# Patient Record
Sex: Male | Born: 1975 | Race: White | Hispanic: No | Marital: Married | Smoking: Current some day smoker
Health system: Southern US, Community
[De-identification: ages and names within clinical notes are randomized; demographics above are authoritative.]

## PROBLEM LIST (undated history)

## (undated) DIAGNOSIS — Z87828 Personal history of other (healed) physical injury and trauma: Secondary | ICD-10-CM

## (undated) DIAGNOSIS — R2231 Localized swelling, mass and lump, right upper limb: Secondary | ICD-10-CM

## (undated) DIAGNOSIS — K219 Gastro-esophageal reflux disease without esophagitis: Secondary | ICD-10-CM

## (undated) DIAGNOSIS — S5320XA Traumatic rupture of unspecified radial collateral ligament, initial encounter: Secondary | ICD-10-CM

## (undated) HISTORY — PX: WISDOM TOOTH EXTRACTION: SHX21

---

## 2012-02-17 ENCOUNTER — Emergency Department (HOSPITAL_COMMUNITY)
Admission: EM | Admit: 2012-02-17 | Discharge: 2012-02-17 | Disposition: A | Discharge status: Home / Self Care | Payer: BC Managed Care – PPO | Attending: Emergency Medicine | Admitting: Emergency Medicine

## 2012-02-17 ENCOUNTER — Emergency Department (HOSPITAL_COMMUNITY): Payer: BC Managed Care – PPO

## 2012-02-17 ENCOUNTER — Encounter (HOSPITAL_COMMUNITY): Payer: Self-pay | Admitting: Emergency Medicine

## 2012-02-17 DIAGNOSIS — N209 Urinary calculus, unspecified: Secondary | ICD-10-CM

## 2012-02-17 DIAGNOSIS — R109 Unspecified abdominal pain: Secondary | ICD-10-CM | POA: Insufficient documentation

## 2012-02-17 DIAGNOSIS — N23 Unspecified renal colic: Secondary | ICD-10-CM

## 2012-02-17 LAB — COMPREHENSIVE METABOLIC PANEL
ALT: 48 U/L (ref 0–53)
AST: 36 U/L (ref 0–37)
Alkaline Phosphatase: 82 U/L (ref 39–117)
CO2: 20 mEq/L (ref 19–32)
Chloride: 102 mEq/L (ref 96–112)
GFR calc Af Amer: >90 mL/min (ref >90)
GFR calc non Af Amer: >90 mL/min (ref >90)
Glucose, Bld: 126 mg/dL — ABNORMAL HIGH (ref 70–99)
Potassium: 3.6 mEq/L (ref 3.5–5.1)
Sodium: 135 mEq/L (ref 135–145)
Total Bilirubin: 0.3 mg/dL (ref 0.3–1.2)

## 2012-02-17 LAB — URINE MICROSCOPIC-ADD ON

## 2012-02-17 LAB — URINALYSIS, ROUTINE W REFLEX MICROSCOPIC
Bilirubin Urine: NEGATIVE
Nitrite: NEGATIVE
Protein, ur: NEGATIVE mg/dL
Specific Gravity, Urine: 1.029 (ref 1.005–1.030)
Urobilinogen, UA: 0.2 mg/dL (ref 0.0–1.0)

## 2012-02-17 LAB — CBC WITH DIFFERENTIAL/PLATELET
Basophils Absolute: 0 10*3/uL (ref 0.0–0.1)
Basophils Relative: 0 % (ref 0–1)
Eosinophils Absolute: 0.1 10*3/uL (ref 0.0–0.7)
MCH: 32.4 pg (ref 26.0–34.0)
MCHC: 35.1 g/dL (ref 30.0–36.0)
Monocytes Relative: 5 % (ref 3–12)
Neutro Abs: 8.5 10*3/uL — ABNORMAL HIGH (ref 1.7–7.7)
Neutrophils Relative %: 72 % (ref 43–77)
RDW: 12.9 % (ref 11.5–15.5)

## 2012-02-17 MED ORDER — OXYCODONE-ACETAMINOPHEN 5-325 MG PO TABS: 1.0000 | ORAL_TABLET | ORAL | Status: AC | PRN

## 2012-02-17 MED ORDER — FENTANYL CITRATE 0.05 MG/ML IJ SOLN
50.0000 ug | Freq: Once | INTRAMUSCULAR | Status: AC
  Administered 2012-02-17: 50 ug via INTRAVENOUS
  Filled 2012-02-17: qty 2

## 2012-02-17 MED ORDER — SODIUM CHLORIDE 0.9 % IV SOLN
1000.0000 mL | INTRAVENOUS | Status: DC
  Administered 2012-02-17: 1000 mL via INTRAVENOUS

## 2012-02-17 MED ORDER — ONDANSETRON HCL 4 MG/2ML IJ SOLN
4.0000 mg | Freq: Once | INTRAMUSCULAR | Status: AC
  Administered 2012-02-17: 4 mg via INTRAVENOUS
  Filled 2012-02-17: qty 2

## 2012-02-17 MED ORDER — LORAZEPAM 2 MG/ML IJ SOLN: 1.0000 mg | Freq: Once | INTRAMUSCULAR | Status: DC

## 2012-02-17 NOTE — ED Notes (Signed)
Pt presenting to ed with c/o left side abdominal pain that radiates into his back pt states onset 6:30am pt states no nausea or vomiting last bowel movement yesterday

## 2012-02-17 NOTE — ED Notes (Signed)
Pt ambulatory too restroom at this time with minimal assistance. Only needed help with IV bag

## 2012-02-17 NOTE — ED Provider Notes (Signed)
History     CSN: [id number]  Arrival date & time 02/17/12  0865   First MD Initiated Contact with Patient 02/17/12 [phone]      Chief Complaint  Patient presents with  . Abdominal Pain    (Consider location/radiation/quality/duration/timing/severity/associated sxs/prior treatment) HPI Comments: Brian [name] is a 36 y.o. Male with sudden onset left flank pain at 6:30 this morning. He has associated urinary urgency, and frequency, but no hematuria. He denies fever, nausea, vomiting, or chest pain. There are no associated aggravating or palliative factors. His tried a medication yet. No known history in the family of kidney stones.  Patient is a 36 y.o. male presenting with abdominal pain. The history is provided by the patient.  Abdominal Pain The primary symptoms of the illness include abdominal pain.    History reviewed. No pertinent past medical history.  History reviewed. No pertinent past surgical history.  No family history on file.  History  Substance Use Topics  . Smoking status: Current Everyday Smoker -- 0.5 packs/day    Types: Cigarettes  . Smokeless tobacco: Not on file  . Alcohol Use: Yes     daily      Review of Systems  Gastrointestinal: Positive for abdominal pain.  All other systems reviewed and are negative.    Allergies  Penicillins  Home Medications   Current Outpatient Rx  Name Route Sig Dispense Refill  . CETIRIZINE HCL 10 MG PO TABS Oral Take 10 mg by mouth daily.    . OMEGA-3 FATTY ACIDS 1000 MG PO CAPS Oral Take 2 g by mouth daily.    Marland Kitchen GARLIC PO Oral Take 1 capsule by mouth daily.    . IBUPROFEN 200 MG PO TABS Oral Take 400 mg by mouth every 8 (eight) hours as needed. For pain.    Marland Kitchen LYSINE PO Oral Take 1 capsule by mouth daily.    . ADULT MULTIVITAMIN W/MINERALS CH Oral Take 1 tablet by mouth daily.    Marland Kitchen OVER THE COUNTER MEDICATION Oral Take 1 tablet by mouth daily. Potassium OTC supplement.    Marland Kitchen VITAMIN B-12 1000 MCG PO TABS Oral Take  1,000 mcg by mouth daily.    . OXYCODONE-ACETAMINOPHEN 5-325 MG PO TABS Oral Take 1 tablet by mouth every 4 (four) hours as needed for pain. 20 tablet 0    BP 120/70  Pulse 72  Temp 97.6 F (36.4 C) (Oral)  Resp 20  SpO2 100%  Physical Exam  Nursing note and vitals reviewed. Constitutional: He is oriented to person, place, and time. He appears well-developed and well-nourished.  HENT:  Head: Normocephalic and atraumatic.  Right Ear: External ear normal.  Left Ear: External ear normal.  Eyes: Conjunctivae and EOM are normal. Pupils are equal, round, and reactive to light.  Neck: Normal range of motion and phonation normal. Neck supple.  Cardiovascular: Normal rate, regular rhythm, normal heart sounds and intact distal pulses.   Pulmonary/Chest: Effort normal and breath sounds normal. He exhibits no bony tenderness.  Abdominal: Soft. Normal appearance. He exhibits no distension. There is no tenderness.  Genitourinary:       Mild left costovertebral angle tenderness  Musculoskeletal: Normal range of motion.  Neurological: He is alert and oriented to person, place, and time. He has normal strength. No cranial nerve deficit or sensory deficit. He exhibits normal muscle tone. Coordination normal.  Skin: Skin is warm, dry and intact.  Psychiatric: He has a normal mood and affect. His behavior is normal. Judgment and  thought content normal.    ED Course  Procedures (including critical care time)  Emergency department treatment: IV, Dilaudid, and Zofran.  Reevaluation: 11:50- pain, resolved. Findings discussed with patient and wife. Urine culture ordered  Labs Reviewed  COMPREHENSIVE METABOLIC PANEL - Abnormal; Notable for the following:    Glucose, Bld 126 (*)     All other components within normal limits  CBC WITH DIFFERENTIAL - Abnormal; Notable for the following:    WBC 11.8 (*)     Neutro Abs 8.5 (*)     All other components within normal limits  URINALYSIS, ROUTINE W REFLEX  MICROSCOPIC - Abnormal; Notable for the following:    APPearance CLOUDY (*)     Hgb urine dipstick SMALL (*)     All other components within normal limits  URINE MICROSCOPIC-ADD ON - Abnormal; Notable for the following:    Bacteria, UA FEW (*)     Crystals CA OXALATE CRYSTALS (*)     All other components within normal limits  URINE CULTURE   Ct Abdomen Pelvis Wo Contrast  02/17/2012  *RADIOLOGY REPORT*  Clinical Data: Flank pain  CT ABDOMEN AND PELVIS WITHOUT CONTRAST  Technique:  Multidetector CT imaging of the abdomen and pelvis was performed following the standard protocol without intravenous contrast.  Comparison: None  Findings: The lung bases are clear.  There is no focal liver abnormality.  The gallbladder is normal. No biliary dilatation.  Normal appearance of the pancreas.  The spleen is unremarkable.  Both adrenal glands appear normal.  The right kidney is normal. There is mild edema involving the left kidney.  Subtle perinephric and periureteral fat stranding is identified.  No significant hydronephrosis or hydroureter noted.  No renal calculi identified.  Within the urinary bladder near the left UVJ there is a stone which measures 2 mm, image 88.  There is no free fluid or fluid collections within the upper abdomen or the pelvis.  No enlarged lymph nodes within the upper abdomen or the pelvis. The stomach and small bowel loops are normal.  The appendix is identified and is normal.  The colon is unremarkable.  Review of the visualized osseous structures is unremarkable.  IMPRESSION:  1.  Mild edema involving the left kidney and left ureter is identified.  The renal stone is identified within the left side of the bladder near the left UVJ measuring approximately 2 mm.  Original Report Authenticated By: Rosealee Albee, M.D.     1. Urolithiasis   2. Ureter colic       MDM  Small, distal left ureter stone, with mild hydronephrosis. This has a high likelihood of passing. Within the first  72 hours. Possible UTI, urine culture has been ordered. Patient stable for discharge.   Plan: Home Medications- Percocet; Home Treatments- Strain urine; Recommended follow up- Urology 1 week and prn        Flint Melter, MD 02/17/12 1206

## 2012-02-19 LAB — URINE CULTURE
Colony Count: NO GROWTH
Culture: NO GROWTH

## 2013-08-09 ENCOUNTER — Emergency Department (HOSPITAL_COMMUNITY): Payer: BC Managed Care – PPO

## 2013-08-09 ENCOUNTER — Emergency Department (HOSPITAL_COMMUNITY)
Admission: EM | Admit: 2013-08-09 | Discharge: 2013-08-09 | Disposition: A | Discharge status: Home / Self Care | Payer: BC Managed Care – PPO | Attending: Emergency Medicine | Admitting: Emergency Medicine

## 2013-08-09 ENCOUNTER — Encounter (HOSPITAL_COMMUNITY): Payer: Self-pay | Admitting: Emergency Medicine

## 2013-08-09 DIAGNOSIS — M542 Cervicalgia: Secondary | ICD-10-CM

## 2013-08-09 DIAGNOSIS — M79643 Pain in unspecified hand: Secondary | ICD-10-CM

## 2013-08-09 DIAGNOSIS — Z79899 Other long term (current) drug therapy: Secondary | ICD-10-CM | POA: Insufficient documentation

## 2013-08-09 DIAGNOSIS — Y9389 Activity, other specified: Secondary | ICD-10-CM | POA: Insufficient documentation

## 2013-08-09 DIAGNOSIS — S6990XA Unspecified injury of unspecified wrist, hand and finger(s), initial encounter: Secondary | ICD-10-CM | POA: Insufficient documentation

## 2013-08-09 DIAGNOSIS — Z880 Allergy status to penicillin: Secondary | ICD-10-CM | POA: Insufficient documentation

## 2013-08-09 DIAGNOSIS — S199XXA Unspecified injury of neck, initial encounter: Secondary | ICD-10-CM

## 2013-08-09 DIAGNOSIS — F17200 Nicotine dependence, unspecified, uncomplicated: Secondary | ICD-10-CM | POA: Insufficient documentation

## 2013-08-09 DIAGNOSIS — S6980XA Other specified injuries of unspecified wrist, hand and finger(s), initial encounter: Secondary | ICD-10-CM | POA: Insufficient documentation

## 2013-08-09 DIAGNOSIS — Y92410 Unspecified street and highway as the place of occurrence of the external cause: Secondary | ICD-10-CM | POA: Insufficient documentation

## 2013-08-09 DIAGNOSIS — R0789 Other chest pain: Secondary | ICD-10-CM

## 2013-08-09 DIAGNOSIS — M47812 Spondylosis without myelopathy or radiculopathy, cervical region: Secondary | ICD-10-CM | POA: Insufficient documentation

## 2013-08-09 DIAGNOSIS — S298XXA Other specified injuries of thorax, initial encounter: Secondary | ICD-10-CM | POA: Insufficient documentation

## 2013-08-09 DIAGNOSIS — S0993XA Unspecified injury of face, initial encounter: Secondary | ICD-10-CM | POA: Insufficient documentation

## 2013-08-09 MED ORDER — MELOXICAM 7.5 MG PO TABS: 15.0000 mg | ORAL_TABLET | Freq: Every day | ORAL | Status: DC

## 2013-08-09 MED ORDER — OXYCODONE-ACETAMINOPHEN 5-325 MG PO TABS: 1.0000 | ORAL_TABLET | ORAL | Status: DC | PRN

## 2013-08-09 NOTE — ED Notes (Signed)
Pts right index finger around the proximal joint is bruised and slightly
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
## 2013-08-09 NOTE — Discharge Instructions (Signed)
Take the prescribed medication as directed.  Do not drive while taking percocet. May wish to apply heat to affected areas to help with muscle soreness and stiffness. Return to the ED for new or worsening symptoms.
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
## 2013-08-11 NOTE — ED Provider Notes (Signed)
Medical screening examination/treatment/procedure(s) were performed by non-physician practitioner and as supervising physician I was immediately available for consultation/collaboration.    Kathalene Frames, MD 08/11/13 [phone]

## 2013-09-12 ENCOUNTER — Ambulatory Visit
Admission: RE | Admit: 2013-09-12 | Discharge: 2013-09-12 | Disposition: A | Discharge status: Home / Self Care | Payer: BC Managed Care – PPO | Source: Ambulatory Visit | Attending: Family Medicine | Admitting: Family Medicine

## 2013-09-12 ENCOUNTER — Other Ambulatory Visit: Payer: Self-pay | Admitting: Family Medicine

## 2013-09-12 DIAGNOSIS — R0789 Other chest pain: Secondary | ICD-10-CM

## 2013-09-16 ENCOUNTER — Other Ambulatory Visit: Payer: Self-pay | Admitting: Orthopedic Surgery

## 2013-09-16 DIAGNOSIS — S63639A Sprain of interphalangeal joint of unspecified finger, initial encounter: Secondary | ICD-10-CM

## 2013-09-24 ENCOUNTER — Ambulatory Visit
Admission: RE | Admit: 2013-09-24 | Discharge: 2013-09-24 | Disposition: A | Discharge status: Home / Self Care | Payer: BC Managed Care – PPO | Source: Ambulatory Visit | Attending: Orthopedic Surgery | Admitting: Orthopedic Surgery

## 2013-09-24 DIAGNOSIS — S63639A Sprain of interphalangeal joint of unspecified finger, initial encounter: Secondary | ICD-10-CM

## 2013-09-24 MED ORDER — IOHEXOL 180 MG/ML  SOLN
5.0000 mL | Freq: Once | INTRAMUSCULAR | Status: AC | PRN
  Administered 2013-09-24: 5 mL via INTRA_ARTICULAR

## 2013-10-01 ENCOUNTER — Other Ambulatory Visit: Payer: Self-pay | Admitting: Orthopedic Surgery

## 2013-10-01 MED ORDER — CHLORHEXIDINE GLUCONATE 4 % EX LIQD: 60.0000 mL | Freq: Once | CUTANEOUS | Status: DC

## 2013-10-01 MED ORDER — VANCOMYCIN HCL 10 G IV SOLR: 1000.0000 mg | INTRAVENOUS | Status: AC

## 2013-10-09 DIAGNOSIS — R2231 Localized swelling, mass and lump, right upper limb: Secondary | ICD-10-CM

## 2013-10-09 DIAGNOSIS — S5320XA Traumatic rupture of unspecified radial collateral ligament, initial encounter: Secondary | ICD-10-CM

## 2013-10-09 HISTORY — DX: Localized swelling, mass and lump, right upper limb: R22.31

## 2013-10-09 HISTORY — DX: Traumatic rupture of unspecified radial collateral ligament, initial encounter: S53.20XA

## 2013-10-10 ENCOUNTER — Encounter (HOSPITAL_BASED_OUTPATIENT_CLINIC_OR_DEPARTMENT_OTHER): Payer: Self-pay | Admitting: *Deleted

## 2013-10-16 ENCOUNTER — Ambulatory Visit (HOSPITAL_BASED_OUTPATIENT_CLINIC_OR_DEPARTMENT_OTHER): Payer: BC Managed Care – PPO | Admitting: Anesthesiology

## 2013-10-16 ENCOUNTER — Encounter (HOSPITAL_BASED_OUTPATIENT_CLINIC_OR_DEPARTMENT_OTHER): Payer: Self-pay | Admitting: Anesthesiology

## 2013-10-16 ENCOUNTER — Encounter (HOSPITAL_BASED_OUTPATIENT_CLINIC_OR_DEPARTMENT_OTHER)
Admission: RE | Disposition: A | Discharge status: Home / Self Care | Payer: Self-pay | Source: Ambulatory Visit | Attending: Orthopedic Surgery

## 2013-10-16 ENCOUNTER — Encounter (HOSPITAL_BASED_OUTPATIENT_CLINIC_OR_DEPARTMENT_OTHER): Payer: BC Managed Care – PPO | Admitting: Anesthesiology

## 2013-10-16 ENCOUNTER — Ambulatory Visit (HOSPITAL_BASED_OUTPATIENT_CLINIC_OR_DEPARTMENT_OTHER)
Admission: RE | Admit: 2013-10-16 | Discharge: 2013-10-16 | Disposition: A | Discharge status: Home / Self Care | Payer: BC Managed Care – PPO | Source: Ambulatory Visit | Attending: Orthopedic Surgery | Admitting: Orthopedic Surgery

## 2013-10-16 DIAGNOSIS — S53439A Radial collateral ligament sprain of unspecified elbow, initial encounter: Secondary | ICD-10-CM | POA: Insufficient documentation

## 2013-10-16 DIAGNOSIS — D481 Neoplasm of uncertain behavior of connective and other soft tissue: Secondary | ICD-10-CM | POA: Insufficient documentation

## 2013-10-16 DIAGNOSIS — D4819 Other specified neoplasm of uncertain behavior of connective and other soft tissue: Secondary | ICD-10-CM | POA: Insufficient documentation

## 2013-10-16 DIAGNOSIS — K219 Gastro-esophageal reflux disease without esophagitis: Secondary | ICD-10-CM | POA: Insufficient documentation

## 2013-10-16 HISTORY — PX: LIGAMENT REPAIR: SHX5444

## 2013-10-16 HISTORY — DX: Localized swelling, mass and lump, right upper limb: R22.31

## 2013-10-16 HISTORY — PX: MASS EXCISION: SHX2000

## 2013-10-16 HISTORY — DX: Traumatic rupture of unspecified radial collateral ligament, initial encounter: S53.20XA

## 2013-10-16 HISTORY — DX: Personal history of other (healed) physical injury and trauma: Z87.828

## 2013-10-16 HISTORY — DX: Gastro-esophageal reflux disease without esophagitis: K21.9

## 2013-10-16 LAB — POCT HEMOGLOBIN-HEMACUE: Hemoglobin: 14.6 g/dL (ref 13.0–17.0)

## 2013-10-16 SURGERY — REPAIR, LIGAMENT
Anesthesia: General | Site: Finger | Laterality: Right

## 2013-10-16 MED ORDER — DEXTROSE-NACL 5-0.45 % IV SOLN: INTRAVENOUS | Status: DC

## 2013-10-16 MED ORDER — VANCOMYCIN HCL 10 G IV SOLR
1500.0000 mg | INTRAVENOUS | Status: AC
  Administered 2013-10-16: 1500 mg via INTRAVENOUS

## 2013-10-16 MED ORDER — PROPOFOL 10 MG/ML IV BOLUS
INTRAVENOUS | Status: DC | PRN
  Administered 2013-10-16: 100 mg via INTRAVENOUS
  Administered 2013-10-16: 200 mg via INTRAVENOUS

## 2013-10-16 MED ORDER — ONDANSETRON HCL 4 MG/2ML IJ SOLN: 4.0000 mg | Freq: Once | INTRAMUSCULAR | Status: DC | PRN

## 2013-10-16 MED ORDER — BUPIVACAINE HCL (PF) 0.25 % IJ SOLN
INTRAMUSCULAR | Status: AC
  Filled 2013-10-16: qty 30

## 2013-10-16 MED ORDER — FENTANYL CITRATE 0.05 MG/ML IJ SOLN
50.0000 ug | INTRAMUSCULAR | Status: DC | PRN
  Administered 2013-10-16: 100 ug via INTRAVENOUS

## 2013-10-16 MED ORDER — 0.9 % SODIUM CHLORIDE (POUR BTL) OPTIME
TOPICAL | Status: DC | PRN
  Administered 2013-10-16: 300 mL

## 2013-10-16 MED ORDER — DEXAMETHASONE SODIUM PHOSPHATE 4 MG/ML IJ SOLN
INTRAMUSCULAR | Status: DC | PRN
  Administered 2013-10-16: 10 mg via INTRAVENOUS

## 2013-10-16 MED ORDER — MIDAZOLAM HCL 2 MG/2ML IJ SOLN
INTRAMUSCULAR | Status: AC
  Filled 2013-10-16: qty 2

## 2013-10-16 MED ORDER — FENTANYL CITRATE 0.05 MG/ML IJ SOLN
INTRAMUSCULAR | Status: DC | PRN
  Administered 2013-10-16 (×2): 25 ug via INTRAVENOUS
  Administered 2013-10-16: 100 ug via INTRAVENOUS
  Administered 2013-10-16: 25 ug via INTRAVENOUS

## 2013-10-16 MED ORDER — OXYCODONE HCL 5 MG PO TABS: 5.0000 mg | ORAL_TABLET | Freq: Once | ORAL | Status: DC | PRN

## 2013-10-16 MED ORDER — MIDAZOLAM HCL 2 MG/ML PO SYRP: 12.0000 mg | ORAL_SOLUTION | Freq: Once | ORAL | Status: DC | PRN

## 2013-10-16 MED ORDER — DEXAMETHASONE SODIUM PHOSPHATE 10 MG/ML IJ SOLN
INTRAMUSCULAR | Status: DC | PRN
  Administered 2013-10-16: 10 mg

## 2013-10-16 MED ORDER — FENTANYL CITRATE 0.05 MG/ML IJ SOLN
INTRAMUSCULAR | Status: AC
  Filled 2013-10-16: qty 4

## 2013-10-16 MED ORDER — MIDAZOLAM HCL 2 MG/2ML IJ SOLN
1.0000 mg | INTRAMUSCULAR | Status: DC | PRN
  Administered 2013-10-16: 2 mg via INTRAVENOUS

## 2013-10-16 MED ORDER — OXYCODONE HCL 5 MG/5ML PO SOLN: 5.0000 mg | Freq: Once | ORAL | Status: DC | PRN

## 2013-10-16 MED ORDER — LIDOCAINE HCL (CARDIAC) 20 MG/ML IV SOLN
INTRAVENOUS | Status: DC | PRN
Start: 2013-10-16 — End: 2013-10-16
  Administered 2013-10-16: 40 mg via INTRAVENOUS

## 2013-10-16 MED ORDER — BUPIVACAINE-EPINEPHRINE PF 0.5-1:200000 % IJ SOLN
INTRAMUSCULAR | Status: DC | PRN
  Administered 2013-10-16: 24 mL via PERINEURAL

## 2013-10-16 MED ORDER — BUPIVACAINE HCL (PF) 0.25 % IJ SOLN
INTRAMUSCULAR | Status: DC | PRN
  Administered 2013-10-16: 7 mL

## 2013-10-16 MED ORDER — MIDAZOLAM HCL 5 MG/5ML IJ SOLN
INTRAMUSCULAR | Status: DC | PRN
  Administered 2013-10-16: 2 mg via INTRAVENOUS

## 2013-10-16 MED ORDER — CHLORHEXIDINE GLUCONATE 4 % EX LIQD
60.0000 mL | Freq: Once | CUTANEOUS | Status: DC
Start: 2013-10-16 — End: 2013-10-16

## 2013-10-16 MED ORDER — LIDOCAINE HCL (PF) 1 % IJ SOLN
INTRAMUSCULAR | Status: AC
  Filled 2013-10-16: qty 5

## 2013-10-16 MED ORDER — OXYCODONE-ACETAMINOPHEN 10-325 MG PO TABS: 1.0000 | ORAL_TABLET | ORAL | Status: AC | PRN

## 2013-10-16 MED ORDER — LACTATED RINGERS IV SOLN
INTRAVENOUS | Status: DC
  Administered 2013-10-16 (×2): via INTRAVENOUS

## 2013-10-16 MED ORDER — FENTANYL CITRATE 0.05 MG/ML IJ SOLN
INTRAMUSCULAR | Status: AC
  Filled 2013-10-16: qty 2

## 2013-10-16 MED ORDER — VANCOMYCIN HCL IN DEXTROSE 1-5 GM/200ML-% IV SOLN
INTRAVENOUS | Status: AC
  Filled 2013-10-16: qty 200

## 2013-10-16 MED ORDER — HYDROMORPHONE HCL PF 1 MG/ML IJ SOLN: 0.2500 mg | INTRAMUSCULAR | Status: DC | PRN

## 2013-10-16 MED ORDER — VANCOMYCIN HCL IN DEXTROSE 500-5 MG/100ML-% IV SOLN
INTRAVENOUS | Status: AC
  Filled 2013-10-16: qty 100

## 2013-10-16 MED ORDER — VANCOMYCIN HCL 1000 MG IV SOLR
1500.0000 mg | INTRAVENOUS | Status: DC | PRN
  Administered 2013-10-16: 1500 mg via INTRAVENOUS

## 2013-10-16 MED ORDER — ONDANSETRON HCL 4 MG/2ML IJ SOLN
INTRAMUSCULAR | Status: DC | PRN
  Administered 2013-10-16: 4 mg via INTRAVENOUS

## 2013-10-16 SURGICAL SUPPLY — 82 items
BANDAGE COBAN STERILE 2 (GAUZE/BANDAGES/DRESSINGS) IMPLANT
BLADE MINI RND TIP GREEN BEAV (BLADE) ×3 IMPLANT
BLADE SURG 15 STRL LF DISP TIS (BLADE) ×1 IMPLANT
BLADE SURG 15 STRL SS (BLADE) ×2
BNDG COHESIVE 1X5 TAN STRL LF (GAUZE/BANDAGES/DRESSINGS) IMPLANT
BNDG COHESIVE 3X5 TAN STRL LF (GAUZE/BANDAGES/DRESSINGS) ×3 IMPLANT
BNDG ESMARK 4X9 LF (GAUZE/BANDAGES/DRESSINGS) ×3 IMPLANT
BNDG GAUZE ELAST 4 BULKY (GAUZE/BANDAGES/DRESSINGS) ×3 IMPLANT
CAP PIN PROTECTOR ORTHO WHT (CAP) ×3 IMPLANT
CATH IV 18G (IV SOLUTION) IMPLANT
CHLORAPREP W/TINT 26ML (MISCELLANEOUS) ×3 IMPLANT
CORDS BIPOLAR (ELECTRODE) ×3 IMPLANT
COVER MAYO STAND STRL (DRAPES) ×3 IMPLANT
COVER TABLE BACK 60X90 (DRAPES) ×3 IMPLANT
CUFF TOURNIQUET SINGLE 18IN (TOURNIQUET CUFF) ×3 IMPLANT
DECANTER SPIKE VIAL GLASS SM (MISCELLANEOUS) ×3 IMPLANT
DRAIN PENROSE 1/2X12 LTX STRL (WOUND CARE) IMPLANT
DRAPE EXTREMITY T 121X128X90 (DRAPE) ×3 IMPLANT
DRAPE OEC MINIVIEW 54X84 (DRAPES) ×3 IMPLANT
DRAPE SURG 17X23 STRL (DRAPES) ×3 IMPLANT
GAUZE XEROFORM 1X8 LF (GAUZE/BANDAGES/DRESSINGS) ×3 IMPLANT
GLOVE BIO SURGEON STRL SZ7 (GLOVE) ×3 IMPLANT
GLOVE BIOGEL M STRL SZ7.5 (GLOVE) ×3 IMPLANT
GLOVE BIOGEL PI IND STRL 7.0 (GLOVE) ×1 IMPLANT
GLOVE BIOGEL PI IND STRL 7.5 (GLOVE) ×1 IMPLANT
GLOVE BIOGEL PI IND STRL 8.5 (GLOVE) ×1 IMPLANT
GLOVE BIOGEL PI INDICATOR 7.0 (GLOVE) ×2
GLOVE BIOGEL PI INDICATOR 7.5 (GLOVE) ×2
GLOVE BIOGEL PI INDICATOR 8.5 (GLOVE) ×2
GLOVE ECLIPSE 6.5 STRL STRAW (GLOVE) ×3 IMPLANT
GLOVE EXAM NITRILE MD LF STRL (GLOVE) ×3 IMPLANT
GLOVE SURG ORTHO 8.0 STRL STRW (GLOVE) ×6 IMPLANT
GOWN STRL REUS W/ TWL LRG LVL3 (GOWN DISPOSABLE) ×2 IMPLANT
GOWN STRL REUS W/TWL 2XL LVL3 (GOWN DISPOSABLE) ×3 IMPLANT
GOWN STRL REUS W/TWL LRG LVL3 (GOWN DISPOSABLE) ×4
GOWN STRL REUS W/TWL XL LVL3 (GOWN DISPOSABLE) ×3 IMPLANT
K-WIRE .035X4 (WIRE) ×3 IMPLANT
NDL SAFETY ECLIPSE 18X1.5 (NEEDLE) IMPLANT
NEEDLE 27GAX1X1/2 (NEEDLE) ×3 IMPLANT
NEEDLE ADDISON D1/2 CIR (NEEDLE) IMPLANT
NEEDLE FISTULA 1/2 CIRCLE (NEEDLE) IMPLANT
NEEDLE HYPO 18GX1.5 SHARP (NEEDLE)
NEEDLE KEITH (NEEDLE) IMPLANT
NEEDLE KEITH SZ10 STRAIGHT (NEEDLE) IMPLANT
NS IRRIG 1000ML POUR BTL (IV SOLUTION) ×3 IMPLANT
PACK BASIN DAY SURGERY FS (CUSTOM PROCEDURE TRAY) ×3 IMPLANT
PAD CAST 3X4 CTTN HI CHSV (CAST SUPPLIES) ×1 IMPLANT
PADDING CAST ABS 3INX4YD NS (CAST SUPPLIES)
PADDING CAST ABS 4INX4YD NS (CAST SUPPLIES) ×2
PADDING CAST ABS COTTON 3X4 (CAST SUPPLIES) IMPLANT
PADDING CAST ABS COTTON 4X4 ST (CAST SUPPLIES) ×1 IMPLANT
PADDING CAST COTTON 3X4 STRL (CAST SUPPLIES) ×2
PASSER SUT SWANSON 36MM LOOP (INSTRUMENTS) IMPLANT
SET EXT MALE ROTATING LL 32IN (MISCELLANEOUS) IMPLANT
SLEEVE SCD COMPRESS KNEE MED (MISCELLANEOUS) ×3 IMPLANT
SLEEVE SURGEON STRL (DRAPES) ×3 IMPLANT
SPLINT PLASTER CAST XFAST 3X15 (CAST SUPPLIES) ×10 IMPLANT
SPLINT PLASTER XTRA FASTSET 3X (CAST SUPPLIES) ×20
SPONGE GAUZE 4X4 12PLY (GAUZE/BANDAGES/DRESSINGS) ×3 IMPLANT
STOCKINETTE 4X48 STRL (DRAPES) ×3 IMPLANT
SUT ETHIBOND 3-0 V-5 (SUTURE) ×3 IMPLANT
SUT FIBERWIRE 2-0 18 17.9 3/8 (SUTURE)
SUT FIBERWIRE 3-0 18 TAPR NDL (SUTURE)
SUT MERSILENE 2.0 SH NDLE (SUTURE) IMPLANT
SUT MERSILENE 4 0 P 3 (SUTURE) IMPLANT
SUT MON AB 3-0 SH 27 (SUTURE)
SUT MON AB 3-0 SH27 (SUTURE) IMPLANT
SUT SILK 4 0 PS 2 (SUTURE) IMPLANT
SUT STEEL 3 0 (SUTURE) IMPLANT
SUT STEEL 4 0 (SUTURE) IMPLANT
SUT STEEL 4 0 V 26 (SUTURE) IMPLANT
SUT VIC AB 4-0 P2 18 (SUTURE) IMPLANT
SUT VICRYL 4-0 PS2 18IN ABS (SUTURE) IMPLANT
SUT VICRYL RAPID 5 0 P 3 (SUTURE) IMPLANT
SUT VICRYL RAPIDE 4/0 PS 2 (SUTURE) ×6 IMPLANT
SUTURE FIBERWR 2-0 18 17.9 3/8 (SUTURE) IMPLANT
SUTURE FIBERWR 3-0 18 TAPR NDL (SUTURE) IMPLANT
SYR BULB 3OZ (MISCELLANEOUS) ×3 IMPLANT
SYR CONTROL 10ML LL (SYRINGE) ×3 IMPLANT
TOWEL OR 17X24 6PK STRL BLUE (TOWEL DISPOSABLE) ×6 IMPLANT
TOWEL OR NON WOVEN STRL DISP B (DISPOSABLE) ×3 IMPLANT
UNDERPAD 30X30 INCONTINENT (UNDERPADS AND DIAPERS) ×3 IMPLANT

## 2013-10-16 NOTE — Progress Notes (Signed)
Assisted Dr. Crews with right, ultrasound guided, supraclavicular block. Side rails up, monitors on throughout procedure. See vital signs in flow sheet. Tolerated Procedure well. 

## 2013-10-16 NOTE — Transfer of Care (Signed)
Immediate Anesthesia Transfer of Care Note  Patient: Brian [name]  Procedure(s) Performed: Procedure(s): REPAIR RECONSTRUCTION METACARPOPHALANGEAL RADIAL COLLATERAL LIGAMENT RIGHT INDEX FINGER WITH PALMARIS GRAFT (Right) EXCISION MASS RIGHT MIDDLE FINGER (Right)  Patient Location: PACU  Anesthesia Type:General and GA combined with regional for post-op pain  Level of Consciousness: awake, alert , oriented and patient cooperative  Airway & Oxygen Therapy: Patient Spontanous Breathing and Patient connected to face mask oxygen  Post-op Assessment: Report given to PACU RN and Post -op Vital signs reviewed and stable  Post vital signs: Reviewed and stable  Complications: No apparent anesthesia complications

## 2013-10-16 NOTE — Op Note (Addendum)
Other Dictation: Dictation Number [id number] Intra-operative fluoroscopic images in the AP, lateral, and oblique views were taken and evaluated by myself.  Reduction and hardware placement were confirmed.
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
## 2013-10-16 NOTE — Anesthesia Preprocedure Evaluation (Signed)
Anesthesia Evaluation  Patient identified by MRN, date of birth, ID band Patient awake    Reviewed: Allergy & Precautions, H&P , NPO status , Patient's Chart, lab work & pertinent test results  Airway Mallampati: I TM Distance: >3 FB Neck ROM: Full    Dental  (+) Teeth Intact, Dental Advisory Given   Pulmonary Current Smoker,  breath sounds clear to auscultation        Cardiovascular Rhythm:Regular Rate:Normal     Neuro/Psych    GI/Hepatic GERD-  Medicated and Controlled,  Endo/Other    Renal/GU      Musculoskeletal   Abdominal   Peds  Hematology   Anesthesia Other Findings   Reproductive/Obstetrics                           Anesthesia Physical Anesthesia Plan  ASA: I  Anesthesia Plan: General   Post-op Pain Management:    Induction: Intravenous  Airway Management Planned: LMA  Additional Equipment:   Intra-op Plan:   Post-operative Plan: Extubation in OR  Informed Consent: I have reviewed the patients History and Physical, chart, labs and discussed the procedure including the risks, benefits and alternatives for the proposed anesthesia with the patient or authorized representative who has indicated his/her understanding and acceptance.   Dental advisory given  Plan Discussed with: CRNA, Anesthesiologist and Surgeon  Anesthesia Plan Comments:         Anesthesia Quick Evaluation

## 2013-10-16 NOTE — Discharge Instructions (Addendum)
[facility] Instructions °Hand Surgery ° °Wound Care: °Keep your hand elevated above the level of your heart.  Do not allow it to dangle by your side.  Keep the dressing dry and do not remove it unless your doctor advises you to do so.  He will usually change it at the time of your post-op visit.  Moving your fingers is advised to stimulate circulation but will depend on the site of your surgery.  If you have a splint applied, your doctor will advise you regarding movement. ° °Activity: °Do not drive or operate machinery today.  Rest today and then you may return to your normal activity and work as indicated by your physician. ° °Diet:  °Drink liquids today or eat a light diet.  You may resume a regular diet tomorrow.   ° °General expectations: °Pain for two to three days. °Fingers may become slightly swollen. ° °Call your doctor if any of the following occur: °Severe pain not relieved by pain medication. °Elevated temperature. °Dressing soaked with blood. °Inability to move fingers. °White or bluish color to fingers. ° ° °Regional Anesthesia Blocks ° °1. Numbness or the inability to move the "blocked" extremity may last from 3-48 hours after placement. The length of time depends on the medication injected and your individual response to the medication. If the numbness is not going away after 48 hours, call your surgeon. ° °2. The extremity that is blocked will need to be protected until the numbness is gone and the  Strength has returned. Because you cannot feel it, you will need to take extra care to avoid injury. Because it may be weak, you may have difficulty moving it or using it. You may not know what position it is in without looking at it while the block is in effect. ° °3. For blocks in the legs and feet, returning to weight bearing and walking needs to be done carefully. You will need to wait until the numbness is entirely gone and the strength has returned. You should be able to move your leg and foot  normally before you try and bear weight or walk. You will need someone to be with you when you first try to ensure you do not fall and possibly risk injury. ° °4. Bruising and tenderness at the needle site are common side effects and will resolve in a few days. ° °5. Persistent numbness or new problems with movement should be communicated to the surgeon or the [facility] ([phone])/ [facility] ([phone]). ° ° °Post Anesthesia Home Care Instructions ° °Activity: °Get plenty of rest for the remainder of the day. A responsible adult should stay with you for 24 hours following the procedure.  °For the next 24 hours, DO NOT: °-Drive a car °-Operate machinery °-Drink alcoholic beverages °-Take any medication unless instructed by your physician °-Make any legal decisions or sign important papers. ° °Meals: °Start with liquid foods such as gelatin or soup. Progress to regular foods as tolerated. Avoid greasy, spicy, heavy foods. If nausea and/or vomiting occur, drink only clear liquids until the nausea and/or vomiting subsides. Call your physician if vomiting continues. ° °Special Instructions/Symptoms: °Your throat may feel dry or sore from the anesthesia or the breathing tube placed in your throat during surgery. If this causes discomfort, gargle with warm salt water. The discomfort should disappear within 24 hours. ° °

## 2013-10-16 NOTE — Anesthesia Postprocedure Evaluation (Signed)
  Anesthesia Post-op Note  Patient: Brian [name]  Procedure(s) Performed: Procedure(s): REPAIR RECONSTRUCTION METACARPOPHALANGEAL RADIAL COLLATERAL LIGAMENT RIGHT INDEX FINGER WITH PALMARIS GRAFT (Right) EXCISION MASS RIGHT MIDDLE FINGER (Right)  Patient Location: PACU  Anesthesia Type:GA combined with regional for post-op pain  Level of Consciousness: awake, alert  and oriented  Airway and Oxygen Therapy: Patient Spontanous Breathing  Post-op Pain: none  Post-op Assessment: Post-op Vital signs reviewed  Post-op Vital Signs: Reviewed  Last Vitals:  Filed Vitals:   10/16/13 1115  BP: 125/83  Pulse: 74  Temp: 36.4 C  Resp: 20    Complications: No apparent anesthesia complications

## 2013-10-16 NOTE — Brief Op Note (Signed)
10/16/2013  9:58 AM  PATIENT:  Brian [name]  38 y.o. male  PRE-OPERATIVE DIAGNOSIS:  REPETIVE RADIAL COLLATERAL LIGAMENT METACARPOPHALANGEAL RIGHT INDEX FINGER; MASS RIGHT MIDDLE FINGER PROXIMAL PHALANX  POST-OPERATIVE DIAGNOSIS:  REPETIVE RADIAL COLLATERAL LIGAMENT METACARPOPHALANGEAL RIGHT INDEX FINGER; MASS RIGHT MIDDLE FINGER PROXIMAL PHALANX  PROCEDURE:  Procedure(s): REPAIR RECONSTRUCTION METACARPOPHALANGEAL RADIAL COLLATERAL LIGAMENT RIGHT INDEX FINGER WITH PALMARIS GRAFT (Right) EXCISION MASS RIGHT MIDDLE FINGER (Right)  SURGEON:  Surgeon(s) and Role:    * Wynonia Sours, MD - Primary  PHYSICIAN ASSISTANT:   ASSISTANTS: R obert Dasnoit ,PAC   ANESTHESIA:   local, regional and general  EBL:  Total I/O In: 1400 [I.V.:1400] Out: -   BLOOD ADMINISTERED:none  DRAINS: none   LOCAL MEDICATIONS USED:  BUPIVICAINE   SPECIMEN:  No Specimen  DISPOSITION OF SPECIMEN:  N/A  COUNTS:  YES  TOURNIQUET:   Total Tourniquet Time Documented: Upper Arm (Right) - 61 minutes Total: Upper Arm (Right) - 61 minutes   DICTATION: .Other Dictation: Dictation Number [id number]  PLAN OF CARE: Discharge to home after PACU  PATIENT DISPOSITION:  PACU - hemodynamically stable.

## 2013-10-16 NOTE — Anesthesia Procedure Notes (Addendum)
Anesthesia Regional Block:  Supraclavicular block  Pre-Anesthetic Checklist: ,, timeout performed, Correct Patient, Correct Site, Correct Laterality, Correct Procedure, Correct Position, site marked, Risks and benefits discussed,  Surgical consent,  Pre-op evaluation,  At surgeon's request and post-op pain management  Laterality: Right and Upper  Prep: chloraprep       Needles:  Injection technique: Single-shot  Needle Type: Echogenic Stimulator Needle     Needle Length: 5cm 5 cm Needle Gauge: 21 and 21 G    Additional Needles:  Procedures: ultrasound guided (picture in chart) Supraclavicular block Narrative:  Start time: 10/16/2013 8:10 AM End time: 10/16/2013 8:20 AM Injection made incrementally with aspirations every 5 mL.  Performed by: Personally  Anesthesiologist: Lorrene Reid   Procedure Name: LMA Insertion Date/Time: 10/16/2013 8:45 AM Performed by: Marrianne Mood Pre-anesthesia Checklist: Patient identified, Emergency Drugs available, Suction available, Patient being monitored and Timeout performed Patient Re-evaluated:Patient Re-evaluated prior to inductionOxygen Delivery Method: Circle System Utilized Preoxygenation: Pre-oxygenation with 100% oxygen Intubation Type: IV induction Ventilation: Mask ventilation without difficulty LMA: LMA inserted LMA Size: 4.0 Number of attempts: 1 Airway Equipment and Method: bite block Placement Confirmation: positive ETCO2 and breath sounds checked- equal and bilateral Tube secured with: Tape Dental Injury: Teeth and Oropharynx as per pre-operative assessment

## 2013-10-17 ENCOUNTER — Encounter (HOSPITAL_BASED_OUTPATIENT_CLINIC_OR_DEPARTMENT_OTHER): Payer: Self-pay | Admitting: Orthopedic Surgery
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
## 2013-10-17 NOTE — Addendum Note (Signed)
Addendum created 10/17/13 1328 by Willa Frater, CRNA   Modules edited: Charges VN

## 2014-02-20 ENCOUNTER — Ambulatory Visit: Payer: Self-pay | Admitting: Podiatry
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
## 2015-03-20 IMAGING — CR DG FINGER INDEX 2+V*R*
3 series · 3 of 3 positions shown · non-contrast
Comparison: None.

CLINICAL DATA: Pain and bruising over the proximal index finger at
the MCP joint.

EXAM:
RIGHT INDEX FINGER 2+V

[x finger pa right]
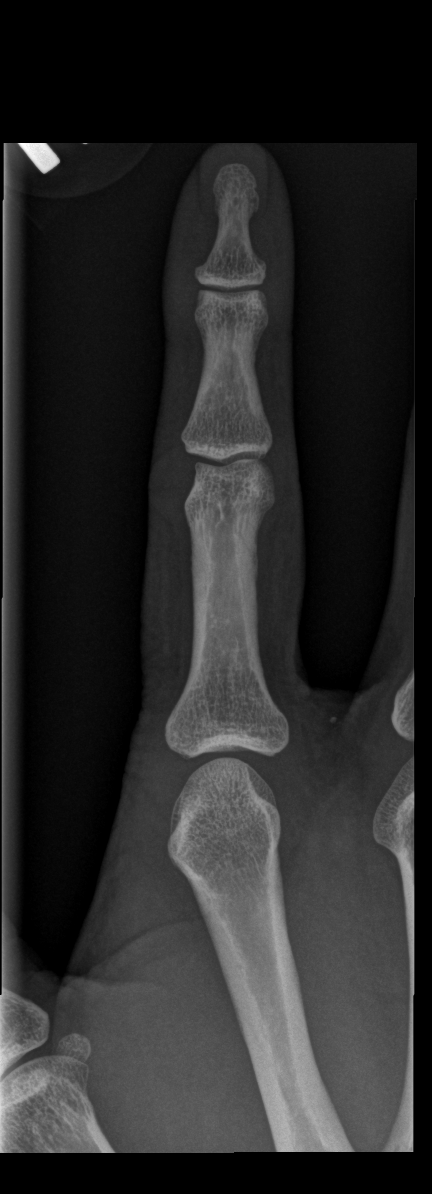

[x finger obl right]
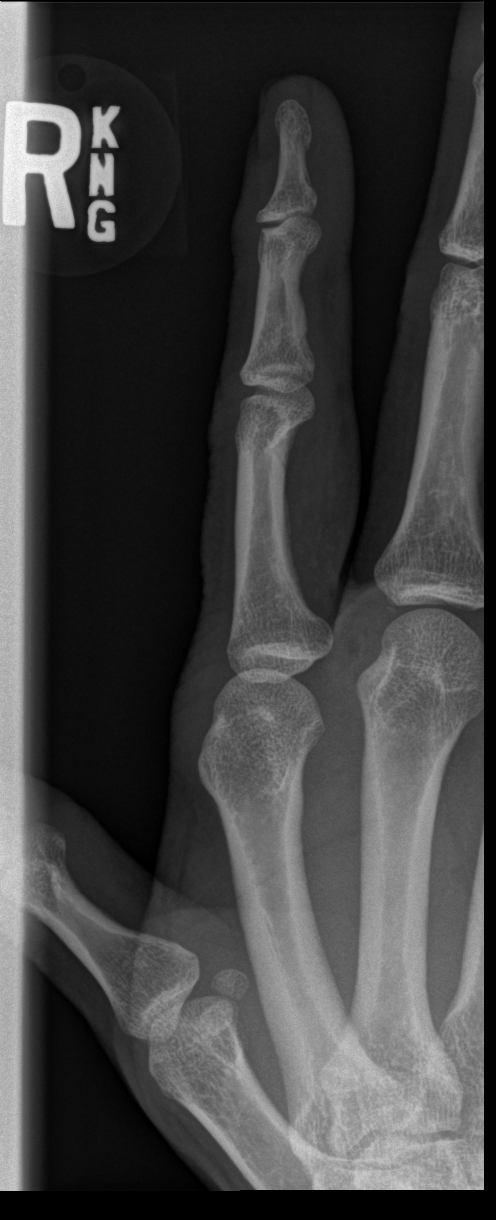

[x finger lat right]
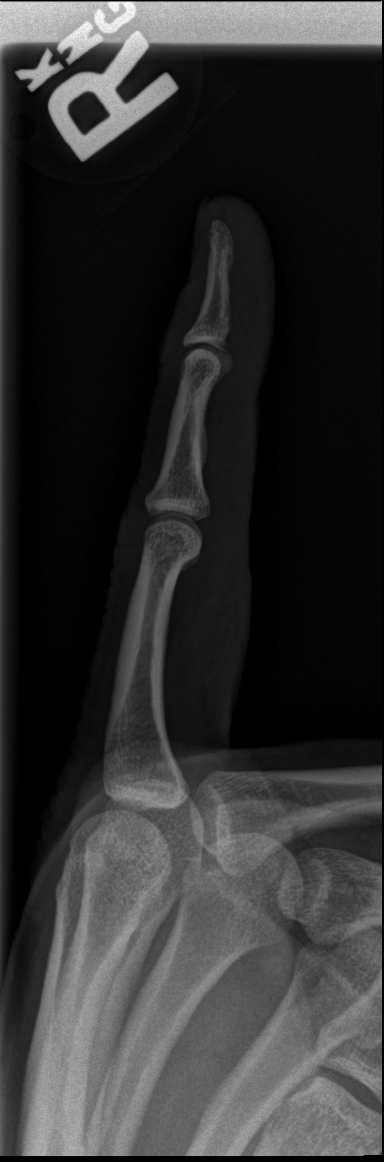

[3 of 3 positions shown; findings below may reference images not displayed]

FINDINGS: Mild soft tissue swelling is evident at the MCP joint. No acute
osseous abnormalities present. The joints are located. No radiopaque
foreign body is present.
IMPRESSION: Soft tissue swelling over the MCP without an acute osseous
abnormality.

## 2016-01-07 DIAGNOSIS — L237 Allergic contact dermatitis due to plants, except food: Secondary | ICD-10-CM | POA: Diagnosis not present

## 2016-10-04 DIAGNOSIS — Z0000 Encounter for general adult medical examination without abnormal findings: Secondary | ICD-10-CM | POA: Diagnosis not present

## 2016-10-04 DIAGNOSIS — M545 Low back pain: Secondary | ICD-10-CM | POA: Diagnosis not present

## 2016-10-04 DIAGNOSIS — J309 Allergic rhinitis, unspecified: Secondary | ICD-10-CM | POA: Diagnosis not present

## 2016-10-04 DIAGNOSIS — E782 Mixed hyperlipidemia: Secondary | ICD-10-CM | POA: Diagnosis not present

## 2017-04-20 DIAGNOSIS — M25572 Pain in left ankle and joints of left foot: Secondary | ICD-10-CM | POA: Diagnosis not present

## 2017-04-28 DIAGNOSIS — M25572 Pain in left ankle and joints of left foot: Secondary | ICD-10-CM | POA: Diagnosis not present

## 2017-05-04 DIAGNOSIS — M25572 Pain in left ankle and joints of left foot: Secondary | ICD-10-CM | POA: Diagnosis not present

## 2017-09-07 DIAGNOSIS — M25561 Pain in right knee: Secondary | ICD-10-CM | POA: Diagnosis not present

## 2017-09-11 ENCOUNTER — Ambulatory Visit
Admission: RE | Admit: 2017-09-11 | Discharge: 2017-09-11 | Disposition: A | Discharge status: Home / Self Care | Payer: BLUE CROSS/BLUE SHIELD | Source: Ambulatory Visit | Attending: Family Medicine | Admitting: Family Medicine

## 2017-09-11 ENCOUNTER — Other Ambulatory Visit: Payer: Self-pay | Admitting: Family Medicine

## 2017-09-11 DIAGNOSIS — M25561 Pain in right knee: Secondary | ICD-10-CM

## 2017-09-11 DIAGNOSIS — M25461 Effusion, right knee: Secondary | ICD-10-CM | POA: Diagnosis not present

## 2017-09-26 ENCOUNTER — Other Ambulatory Visit: Payer: Self-pay | Admitting: Family Medicine

## 2017-09-26 DIAGNOSIS — M25561 Pain in right knee: Secondary | ICD-10-CM

## 2017-10-05 ENCOUNTER — Ambulatory Visit
Admission: RE | Admit: 2017-10-05 | Discharge: 2017-10-05 | Disposition: A | Discharge status: Home / Self Care | Payer: BLUE CROSS/BLUE SHIELD | Source: Ambulatory Visit | Attending: Family Medicine | Admitting: Family Medicine

## 2017-10-05 DIAGNOSIS — M25561 Pain in right knee: Secondary | ICD-10-CM

## 2017-10-10 DIAGNOSIS — E782 Mixed hyperlipidemia: Secondary | ICD-10-CM | POA: Diagnosis not present

## 2017-10-10 DIAGNOSIS — S8391XA Sprain of unspecified site of right knee, initial encounter: Secondary | ICD-10-CM | POA: Diagnosis not present

## 2017-10-10 DIAGNOSIS — J309 Allergic rhinitis, unspecified: Secondary | ICD-10-CM | POA: Diagnosis not present

## 2017-10-10 DIAGNOSIS — Z0000 Encounter for general adult medical examination without abnormal findings: Secondary | ICD-10-CM | POA: Diagnosis not present

## 2017-10-11 DIAGNOSIS — M6751 Plica syndrome, right knee: Secondary | ICD-10-CM | POA: Diagnosis not present

## 2017-10-11 DIAGNOSIS — M238X1 Other internal derangements of right knee: Secondary | ICD-10-CM | POA: Diagnosis not present

## 2017-11-06 DIAGNOSIS — K219 Gastro-esophageal reflux disease without esophagitis: Secondary | ICD-10-CM | POA: Diagnosis not present

## 2017-11-06 DIAGNOSIS — Z800 Family history of malignant neoplasm of digestive organs: Secondary | ICD-10-CM | POA: Diagnosis not present

## 2017-11-22 DIAGNOSIS — M6751 Plica syndrome, right knee: Secondary | ICD-10-CM | POA: Diagnosis not present

## 2018-02-26 DIAGNOSIS — M9901 Segmental and somatic dysfunction of cervical region: Secondary | ICD-10-CM | POA: Diagnosis not present

## 2018-02-26 DIAGNOSIS — M50122 Cervical disc disorder at C5-C6 level with radiculopathy: Secondary | ICD-10-CM | POA: Diagnosis not present

## 2018-02-26 DIAGNOSIS — M4003 Postural kyphosis, cervicothoracic region: Secondary | ICD-10-CM | POA: Diagnosis not present

## 2018-02-26 DIAGNOSIS — M9902 Segmental and somatic dysfunction of thoracic region: Secondary | ICD-10-CM | POA: Diagnosis not present

## 2018-02-27 DIAGNOSIS — M50122 Cervical disc disorder at C5-C6 level with radiculopathy: Secondary | ICD-10-CM | POA: Diagnosis not present

## 2018-02-27 DIAGNOSIS — M4003 Postural kyphosis, cervicothoracic region: Secondary | ICD-10-CM | POA: Diagnosis not present

## 2018-02-27 DIAGNOSIS — M9902 Segmental and somatic dysfunction of thoracic region: Secondary | ICD-10-CM | POA: Diagnosis not present

## 2018-02-27 DIAGNOSIS — M9901 Segmental and somatic dysfunction of cervical region: Secondary | ICD-10-CM | POA: Diagnosis not present

## 2018-03-01 DIAGNOSIS — M9901 Segmental and somatic dysfunction of cervical region: Secondary | ICD-10-CM | POA: Diagnosis not present

## 2018-03-01 DIAGNOSIS — M4003 Postural kyphosis, cervicothoracic region: Secondary | ICD-10-CM | POA: Diagnosis not present

## 2018-03-01 DIAGNOSIS — M50122 Cervical disc disorder at C5-C6 level with radiculopathy: Secondary | ICD-10-CM | POA: Diagnosis not present

## 2018-03-01 DIAGNOSIS — M9902 Segmental and somatic dysfunction of thoracic region: Secondary | ICD-10-CM | POA: Diagnosis not present

## 2018-03-05 DIAGNOSIS — M545 Low back pain: Secondary | ICD-10-CM | POA: Diagnosis not present

## 2018-03-05 DIAGNOSIS — M9903 Segmental and somatic dysfunction of lumbar region: Secondary | ICD-10-CM | POA: Diagnosis not present

## 2018-03-05 DIAGNOSIS — M9901 Segmental and somatic dysfunction of cervical region: Secondary | ICD-10-CM | POA: Diagnosis not present

## 2018-03-05 DIAGNOSIS — M50122 Cervical disc disorder at C5-C6 level with radiculopathy: Secondary | ICD-10-CM | POA: Diagnosis not present

## 2018-03-06 DIAGNOSIS — M50122 Cervical disc disorder at C5-C6 level with radiculopathy: Secondary | ICD-10-CM | POA: Diagnosis not present

## 2018-03-06 DIAGNOSIS — M9902 Segmental and somatic dysfunction of thoracic region: Secondary | ICD-10-CM | POA: Diagnosis not present

## 2018-03-06 DIAGNOSIS — M9901 Segmental and somatic dysfunction of cervical region: Secondary | ICD-10-CM | POA: Diagnosis not present

## 2018-03-06 DIAGNOSIS — M4003 Postural kyphosis, cervicothoracic region: Secondary | ICD-10-CM | POA: Diagnosis not present

## 2018-03-09 DIAGNOSIS — M9902 Segmental and somatic dysfunction of thoracic region: Secondary | ICD-10-CM | POA: Diagnosis not present

## 2018-03-09 DIAGNOSIS — M9901 Segmental and somatic dysfunction of cervical region: Secondary | ICD-10-CM | POA: Diagnosis not present

## 2018-03-09 DIAGNOSIS — M50122 Cervical disc disorder at C5-C6 level with radiculopathy: Secondary | ICD-10-CM | POA: Diagnosis not present

## 2018-03-09 DIAGNOSIS — M4003 Postural kyphosis, cervicothoracic region: Secondary | ICD-10-CM | POA: Diagnosis not present

## 2018-03-13 DIAGNOSIS — M9902 Segmental and somatic dysfunction of thoracic region: Secondary | ICD-10-CM | POA: Diagnosis not present

## 2018-03-13 DIAGNOSIS — M4003 Postural kyphosis, cervicothoracic region: Secondary | ICD-10-CM | POA: Diagnosis not present

## 2018-03-13 DIAGNOSIS — M9901 Segmental and somatic dysfunction of cervical region: Secondary | ICD-10-CM | POA: Diagnosis not present

## 2018-03-13 DIAGNOSIS — M50122 Cervical disc disorder at C5-C6 level with radiculopathy: Secondary | ICD-10-CM | POA: Diagnosis not present

## 2018-03-14 DIAGNOSIS — M9901 Segmental and somatic dysfunction of cervical region: Secondary | ICD-10-CM | POA: Diagnosis not present

## 2018-03-14 DIAGNOSIS — M50122 Cervical disc disorder at C5-C6 level with radiculopathy: Secondary | ICD-10-CM | POA: Diagnosis not present

## 2018-03-14 DIAGNOSIS — M4003 Postural kyphosis, cervicothoracic region: Secondary | ICD-10-CM | POA: Diagnosis not present

## 2018-03-14 DIAGNOSIS — M9902 Segmental and somatic dysfunction of thoracic region: Secondary | ICD-10-CM | POA: Diagnosis not present

## 2018-03-15 DIAGNOSIS — M4003 Postural kyphosis, cervicothoracic region: Secondary | ICD-10-CM | POA: Diagnosis not present

## 2018-03-15 DIAGNOSIS — M50122 Cervical disc disorder at C5-C6 level with radiculopathy: Secondary | ICD-10-CM | POA: Diagnosis not present

## 2018-03-15 DIAGNOSIS — M9901 Segmental and somatic dysfunction of cervical region: Secondary | ICD-10-CM | POA: Diagnosis not present

## 2018-03-15 DIAGNOSIS — M9902 Segmental and somatic dysfunction of thoracic region: Secondary | ICD-10-CM | POA: Diagnosis not present

## 2018-03-19 DIAGNOSIS — M9902 Segmental and somatic dysfunction of thoracic region: Secondary | ICD-10-CM | POA: Diagnosis not present

## 2018-03-19 DIAGNOSIS — M4003 Postural kyphosis, cervicothoracic region: Secondary | ICD-10-CM | POA: Diagnosis not present

## 2018-03-19 DIAGNOSIS — M9901 Segmental and somatic dysfunction of cervical region: Secondary | ICD-10-CM | POA: Diagnosis not present

## 2018-03-19 DIAGNOSIS — M50122 Cervical disc disorder at C5-C6 level with radiculopathy: Secondary | ICD-10-CM | POA: Diagnosis not present

## 2018-03-21 DIAGNOSIS — M9901 Segmental and somatic dysfunction of cervical region: Secondary | ICD-10-CM | POA: Diagnosis not present

## 2018-03-21 DIAGNOSIS — M9902 Segmental and somatic dysfunction of thoracic region: Secondary | ICD-10-CM | POA: Diagnosis not present

## 2018-03-21 DIAGNOSIS — M4003 Postural kyphosis, cervicothoracic region: Secondary | ICD-10-CM | POA: Diagnosis not present

## 2018-03-21 DIAGNOSIS — M50122 Cervical disc disorder at C5-C6 level with radiculopathy: Secondary | ICD-10-CM | POA: Diagnosis not present

## 2018-03-23 DIAGNOSIS — M9901 Segmental and somatic dysfunction of cervical region: Secondary | ICD-10-CM | POA: Diagnosis not present

## 2018-03-23 DIAGNOSIS — M9902 Segmental and somatic dysfunction of thoracic region: Secondary | ICD-10-CM | POA: Diagnosis not present

## 2018-03-23 DIAGNOSIS — M50122 Cervical disc disorder at C5-C6 level with radiculopathy: Secondary | ICD-10-CM | POA: Diagnosis not present

## 2018-03-23 DIAGNOSIS — M4003 Postural kyphosis, cervicothoracic region: Secondary | ICD-10-CM | POA: Diagnosis not present

## 2018-03-26 DIAGNOSIS — M9902 Segmental and somatic dysfunction of thoracic region: Secondary | ICD-10-CM | POA: Diagnosis not present

## 2018-03-26 DIAGNOSIS — M9901 Segmental and somatic dysfunction of cervical region: Secondary | ICD-10-CM | POA: Diagnosis not present

## 2018-03-26 DIAGNOSIS — M4003 Postural kyphosis, cervicothoracic region: Secondary | ICD-10-CM | POA: Diagnosis not present

## 2018-03-26 DIAGNOSIS — M50122 Cervical disc disorder at C5-C6 level with radiculopathy: Secondary | ICD-10-CM | POA: Diagnosis not present

## 2018-03-28 DIAGNOSIS — M4003 Postural kyphosis, cervicothoracic region: Secondary | ICD-10-CM | POA: Diagnosis not present

## 2018-03-28 DIAGNOSIS — M50122 Cervical disc disorder at C5-C6 level with radiculopathy: Secondary | ICD-10-CM | POA: Diagnosis not present

## 2018-03-28 DIAGNOSIS — M9902 Segmental and somatic dysfunction of thoracic region: Secondary | ICD-10-CM | POA: Diagnosis not present

## 2018-03-28 DIAGNOSIS — M9901 Segmental and somatic dysfunction of cervical region: Secondary | ICD-10-CM | POA: Diagnosis not present

## 2018-04-02 DIAGNOSIS — M4003 Postural kyphosis, cervicothoracic region: Secondary | ICD-10-CM | POA: Diagnosis not present

## 2018-04-02 DIAGNOSIS — M9902 Segmental and somatic dysfunction of thoracic region: Secondary | ICD-10-CM | POA: Diagnosis not present

## 2018-04-02 DIAGNOSIS — M9901 Segmental and somatic dysfunction of cervical region: Secondary | ICD-10-CM | POA: Diagnosis not present

## 2018-04-02 DIAGNOSIS — M50122 Cervical disc disorder at C5-C6 level with radiculopathy: Secondary | ICD-10-CM | POA: Diagnosis not present

## 2018-04-05 DIAGNOSIS — M9901 Segmental and somatic dysfunction of cervical region: Secondary | ICD-10-CM | POA: Diagnosis not present

## 2018-04-05 DIAGNOSIS — M9902 Segmental and somatic dysfunction of thoracic region: Secondary | ICD-10-CM | POA: Diagnosis not present

## 2018-04-05 DIAGNOSIS — M4003 Postural kyphosis, cervicothoracic region: Secondary | ICD-10-CM | POA: Diagnosis not present

## 2018-04-05 DIAGNOSIS — M50122 Cervical disc disorder at C5-C6 level with radiculopathy: Secondary | ICD-10-CM | POA: Diagnosis not present

## 2018-04-16 DIAGNOSIS — M9901 Segmental and somatic dysfunction of cervical region: Secondary | ICD-10-CM | POA: Diagnosis not present

## 2018-04-16 DIAGNOSIS — M4003 Postural kyphosis, cervicothoracic region: Secondary | ICD-10-CM | POA: Diagnosis not present

## 2018-04-16 DIAGNOSIS — M9902 Segmental and somatic dysfunction of thoracic region: Secondary | ICD-10-CM | POA: Diagnosis not present

## 2018-04-16 DIAGNOSIS — M50122 Cervical disc disorder at C5-C6 level with radiculopathy: Secondary | ICD-10-CM | POA: Diagnosis not present

## 2018-05-09 DIAGNOSIS — M542 Cervicalgia: Secondary | ICD-10-CM | POA: Diagnosis not present

## 2018-05-09 DIAGNOSIS — M25521 Pain in right elbow: Secondary | ICD-10-CM | POA: Diagnosis not present

## 2018-05-09 DIAGNOSIS — R200 Anesthesia of skin: Secondary | ICD-10-CM | POA: Diagnosis not present

## 2018-05-11 DIAGNOSIS — R140 Abdominal distension (gaseous): Secondary | ICD-10-CM | POA: Diagnosis not present

## 2018-05-11 DIAGNOSIS — R194 Change in bowel habit: Secondary | ICD-10-CM | POA: Diagnosis not present

## 2018-06-20 DIAGNOSIS — D123 Benign neoplasm of transverse colon: Secondary | ICD-10-CM | POA: Diagnosis not present

## 2018-06-20 DIAGNOSIS — K648 Other hemorrhoids: Secondary | ICD-10-CM | POA: Diagnosis not present

## 2018-06-20 DIAGNOSIS — K529 Noninfective gastroenteritis and colitis, unspecified: Secondary | ICD-10-CM | POA: Diagnosis not present

## 2018-06-20 DIAGNOSIS — R194 Change in bowel habit: Secondary | ICD-10-CM | POA: Diagnosis not present

## 2018-06-20 DIAGNOSIS — D124 Benign neoplasm of descending colon: Secondary | ICD-10-CM | POA: Diagnosis not present

## 2018-06-20 DIAGNOSIS — Z800 Family history of malignant neoplasm of digestive organs: Secondary | ICD-10-CM | POA: Diagnosis not present

## 2018-06-20 DIAGNOSIS — D125 Benign neoplasm of sigmoid colon: Secondary | ICD-10-CM | POA: Diagnosis not present

## 2018-06-20 DIAGNOSIS — K6389 Other specified diseases of intestine: Secondary | ICD-10-CM | POA: Diagnosis not present

## 2018-06-22 DIAGNOSIS — K529 Noninfective gastroenteritis and colitis, unspecified: Secondary | ICD-10-CM | POA: Diagnosis not present

## 2018-06-22 DIAGNOSIS — D123 Benign neoplasm of transverse colon: Secondary | ICD-10-CM | POA: Diagnosis not present

## 2018-06-22 DIAGNOSIS — D124 Benign neoplasm of descending colon: Secondary | ICD-10-CM | POA: Diagnosis not present

## 2019-03-13 DIAGNOSIS — Z0000 Encounter for general adult medical examination without abnormal findings: Secondary | ICD-10-CM | POA: Diagnosis not present

## 2019-04-25 DIAGNOSIS — Z23 Encounter for immunization: Secondary | ICD-10-CM | POA: Diagnosis not present

## 2019-04-25 DIAGNOSIS — Z0000 Encounter for general adult medical examination without abnormal findings: Secondary | ICD-10-CM | POA: Diagnosis not present

## 2019-04-25 DIAGNOSIS — E782 Mixed hyperlipidemia: Secondary | ICD-10-CM | POA: Diagnosis not present

## 2019-04-25 DIAGNOSIS — Z125 Encounter for screening for malignant neoplasm of prostate: Secondary | ICD-10-CM | POA: Diagnosis not present

## 2019-05-24 DIAGNOSIS — I10 Essential (primary) hypertension: Secondary | ICD-10-CM | POA: Diagnosis not present

## 2020-03-11 DIAGNOSIS — D2339 Other benign neoplasm of skin of other parts of face: Secondary | ICD-10-CM | POA: Diagnosis not present

## 2020-03-18 DIAGNOSIS — Z125 Encounter for screening for malignant neoplasm of prostate: Secondary | ICD-10-CM | POA: Diagnosis not present

## 2020-03-18 DIAGNOSIS — I10 Essential (primary) hypertension: Secondary | ICD-10-CM | POA: Diagnosis not present

## 2020-03-18 DIAGNOSIS — Z0000 Encounter for general adult medical examination without abnormal findings: Secondary | ICD-10-CM | POA: Diagnosis not present

## 2020-03-18 DIAGNOSIS — E782 Mixed hyperlipidemia: Secondary | ICD-10-CM | POA: Diagnosis not present

## 2020-03-18 DIAGNOSIS — Z23 Encounter for immunization: Secondary | ICD-10-CM | POA: Diagnosis not present

## 2020-06-15 DIAGNOSIS — Z20822 Contact with and (suspected) exposure to covid-19: Secondary | ICD-10-CM | POA: Diagnosis not present

## 2020-09-16 DIAGNOSIS — I10 Essential (primary) hypertension: Secondary | ICD-10-CM | POA: Diagnosis not present

## 2020-09-16 DIAGNOSIS — E782 Mixed hyperlipidemia: Secondary | ICD-10-CM | POA: Diagnosis not present

## 2020-09-16 DIAGNOSIS — J309 Allergic rhinitis, unspecified: Secondary | ICD-10-CM | POA: Diagnosis not present

## 2020-09-16 DIAGNOSIS — E669 Obesity, unspecified: Secondary | ICD-10-CM | POA: Diagnosis not present

## 2020-12-21 DIAGNOSIS — Z20822 Contact with and (suspected) exposure to covid-19: Secondary | ICD-10-CM | POA: Diagnosis not present

## 2021-03-19 DIAGNOSIS — Z125 Encounter for screening for malignant neoplasm of prostate: Secondary | ICD-10-CM | POA: Diagnosis not present

## 2021-03-19 DIAGNOSIS — I10 Essential (primary) hypertension: Secondary | ICD-10-CM | POA: Diagnosis not present

## 2021-03-19 DIAGNOSIS — E782 Mixed hyperlipidemia: Secondary | ICD-10-CM | POA: Diagnosis not present

## 2021-03-19 DIAGNOSIS — Z0000 Encounter for general adult medical examination without abnormal findings: Secondary | ICD-10-CM | POA: Diagnosis not present

## 2021-03-19 DIAGNOSIS — J301 Allergic rhinitis due to pollen: Secondary | ICD-10-CM | POA: Diagnosis not present

## 2021-03-19 DIAGNOSIS — M25521 Pain in right elbow: Secondary | ICD-10-CM | POA: Diagnosis not present

## 2021-10-22 DIAGNOSIS — J301 Allergic rhinitis due to pollen: Secondary | ICD-10-CM | POA: Diagnosis not present

## 2021-10-22 DIAGNOSIS — M542 Cervicalgia: Secondary | ICD-10-CM | POA: Diagnosis not present

## 2021-10-22 DIAGNOSIS — I10 Essential (primary) hypertension: Secondary | ICD-10-CM | POA: Diagnosis not present

## 2021-10-22 DIAGNOSIS — E782 Mixed hyperlipidemia: Secondary | ICD-10-CM | POA: Diagnosis not present

## 2022-04-15 DIAGNOSIS — Z125 Encounter for screening for malignant neoplasm of prostate: Secondary | ICD-10-CM | POA: Diagnosis not present

## 2022-04-15 DIAGNOSIS — M542 Cervicalgia: Secondary | ICD-10-CM | POA: Diagnosis not present

## 2022-04-15 DIAGNOSIS — J301 Allergic rhinitis due to pollen: Secondary | ICD-10-CM | POA: Diagnosis not present

## 2022-04-15 DIAGNOSIS — I10 Essential (primary) hypertension: Secondary | ICD-10-CM | POA: Diagnosis not present

## 2022-04-15 DIAGNOSIS — Z23 Encounter for immunization: Secondary | ICD-10-CM | POA: Diagnosis not present

## 2022-04-15 DIAGNOSIS — E782 Mixed hyperlipidemia: Secondary | ICD-10-CM | POA: Diagnosis not present

## 2022-04-15 DIAGNOSIS — Z0000 Encounter for general adult medical examination without abnormal findings: Secondary | ICD-10-CM | POA: Diagnosis not present

## 2022-06-03 DIAGNOSIS — B349 Viral infection, unspecified: Secondary | ICD-10-CM | POA: Diagnosis not present

## 2022-10-19 DIAGNOSIS — M542 Cervicalgia: Secondary | ICD-10-CM | POA: Diagnosis not present

## 2022-10-19 DIAGNOSIS — I10 Essential (primary) hypertension: Secondary | ICD-10-CM | POA: Diagnosis not present

## 2022-10-19 DIAGNOSIS — J301 Allergic rhinitis due to pollen: Secondary | ICD-10-CM | POA: Diagnosis not present

## 2022-10-19 DIAGNOSIS — E782 Mixed hyperlipidemia: Secondary | ICD-10-CM | POA: Diagnosis not present

## 2023-05-17 DIAGNOSIS — J301 Allergic rhinitis due to pollen: Secondary | ICD-10-CM | POA: Diagnosis not present

## 2023-05-17 DIAGNOSIS — I10 Essential (primary) hypertension: Secondary | ICD-10-CM | POA: Diagnosis not present

## 2023-05-17 DIAGNOSIS — E669 Obesity, unspecified: Secondary | ICD-10-CM | POA: Diagnosis not present

## 2023-05-17 DIAGNOSIS — Z0000 Encounter for general adult medical examination without abnormal findings: Secondary | ICD-10-CM | POA: Diagnosis not present

## 2023-05-17 DIAGNOSIS — E782 Mixed hyperlipidemia: Secondary | ICD-10-CM | POA: Diagnosis not present

## 2023-05-17 DIAGNOSIS — Z125 Encounter for screening for malignant neoplasm of prostate: Secondary | ICD-10-CM | POA: Diagnosis not present

## 2023-06-02 DIAGNOSIS — R748 Abnormal levels of other serum enzymes: Secondary | ICD-10-CM | POA: Diagnosis not present

## 2023-06-27 DIAGNOSIS — Z860101 Personal history of adenomatous and serrated colon polyps: Secondary | ICD-10-CM | POA: Diagnosis not present

## 2023-06-27 DIAGNOSIS — D123 Benign neoplasm of transverse colon: Secondary | ICD-10-CM | POA: Diagnosis not present

## 2023-06-27 DIAGNOSIS — K648 Other hemorrhoids: Secondary | ICD-10-CM | POA: Diagnosis not present

## 2023-06-27 DIAGNOSIS — Z800 Family history of malignant neoplasm of digestive organs: Secondary | ICD-10-CM | POA: Diagnosis not present

## 2023-06-27 DIAGNOSIS — Z09 Encounter for follow-up examination after completed treatment for conditions other than malignant neoplasm: Secondary | ICD-10-CM | POA: Diagnosis not present

## 2023-06-27 DIAGNOSIS — D124 Benign neoplasm of descending colon: Secondary | ICD-10-CM | POA: Diagnosis not present

## 2023-11-22 DIAGNOSIS — E669 Obesity, unspecified: Secondary | ICD-10-CM | POA: Diagnosis not present

## 2023-11-22 DIAGNOSIS — E782 Mixed hyperlipidemia: Secondary | ICD-10-CM | POA: Diagnosis not present

## 2023-11-22 DIAGNOSIS — J301 Allergic rhinitis due to pollen: Secondary | ICD-10-CM | POA: Diagnosis not present

## 2023-11-22 DIAGNOSIS — I10 Essential (primary) hypertension: Secondary | ICD-10-CM | POA: Diagnosis not present

## 2024-05-29 DIAGNOSIS — E669 Obesity, unspecified: Secondary | ICD-10-CM | POA: Diagnosis not present

## 2024-05-29 DIAGNOSIS — Z0000 Encounter for general adult medical examination without abnormal findings: Secondary | ICD-10-CM | POA: Diagnosis not present

## 2024-05-29 DIAGNOSIS — E782 Mixed hyperlipidemia: Secondary | ICD-10-CM | POA: Diagnosis not present

## 2024-05-29 DIAGNOSIS — I10 Essential (primary) hypertension: Secondary | ICD-10-CM | POA: Diagnosis not present

## 2024-06-19 DIAGNOSIS — L570 Actinic keratosis: Secondary | ICD-10-CM | POA: Diagnosis not present

## 2024-06-19 DIAGNOSIS — I788 Other diseases of capillaries: Secondary | ICD-10-CM | POA: Diagnosis not present
